# Patient Record
Sex: Male | Born: 1955 | Race: Black or African American | Hispanic: No | Marital: Single
Health system: Midwestern US, Community
[De-identification: ages and names within clinical notes are randomized; demographics above are authoritative.]

## PROBLEM LIST (undated history)

## (undated) DIAGNOSIS — I10 Essential (primary) hypertension: Secondary | ICD-10-CM

## (undated) DIAGNOSIS — I4891 Unspecified atrial fibrillation: Secondary | ICD-10-CM

## (undated) DIAGNOSIS — E7800 Pure hypercholesterolemia, unspecified: Secondary | ICD-10-CM

## (undated) DIAGNOSIS — G4730 Sleep apnea, unspecified: Secondary | ICD-10-CM

---

## 2014-02-25 NOTE — ED Provider Notes (Signed)
HPI Comments: 58 year old African-American male presents with a "stinging" pain in his penis off and on for the past week.  Does report he has a history of Trichomonas approximately 2 months ago.  No discharge.  No testicular pain.  No abdominal pain.  No vomiting.    Patient is a 58 y.o. male presenting with penile problem. The history is provided by the patient.   Penis Pain  Primary symptoms include dysuria. Pertinent negatives include no nausea, no vomiting and no abdominal pain.          Past Medical History   Diagnosis Date   ??? Hypercholesterolemia    ??? HTN (hypertension)    ??? Diabetes (HCC)         No past surgical history on file.      No family history on file.     History     Social History   ??? Marital Status: SINGLE     Spouse Name: N/A     Number of Children: N/A   ??? Years of Education: N/A     Occupational History   ??? Not on file.     Social History Main Topics   ??? Smoking status: Never Smoker    ??? Smokeless tobacco: Not on file   ??? Alcohol Use: Yes      Comment: occ   ??? Drug Use: No   ??? Sexual Activity: Not on file     Other Topics Concern   ??? Not on file     Social History Narrative   ??? No narrative on file                  ALLERGIES: Review of patient's allergies indicates no known allergies.      Review of Systems   Constitutional: Negative for fever.   Respiratory: Negative for shortness of breath.    Gastrointestinal: Negative for nausea, vomiting and abdominal pain.   Genitourinary: Positive for dysuria.   Musculoskeletal: Negative for back pain and neck pain.   Neurological: Negative for headaches.       Filed Vitals:    02/25/14 2230   BP: 167/89   Pulse: 66   Temp: 97.9 ??F (36.6 ??C)   Resp: 18   Height:  (1.778 m)   Weight: 131.543 kg (290 lb)   SpO2: 96%            Physical Exam   Constitutional: He appears well-developed and well-nourished. No distress.   HENT:   Head: Normocephalic and atraumatic.   Cardiovascular: Normal rate.    Pulmonary/Chest: Effort normal.    Genitourinary: Penis normal. No penile tenderness.   No discharge noted   Skin: Skin is warm and dry.   Psychiatric: He has a normal mood and affect.   Nursing note and vitals reviewed.       MDM  Number of Diagnoses or Management Options  Diagnosis management comments: Considering history of STD and current symptoms we'll treat empirically for gonorrhea and chlamydia.  Urinalysis shows no evidence of Trichomonas.      Procedures

## 2014-02-25 NOTE — ED Notes (Signed)
Pt presents to ER for penile pain x 2-3 days, no DC, c/o burning and itching.  Pt's sexual partner of 4 yrs has had trichomonas in the past.

## 2014-02-26 LAB — URINE MICROSCOPIC
Bacteria: 0 /hpf
Casts: 0 /lpf
Epithelial cells: 0 /hpf
RBC: 0 /hpf
WBC: 0 /hpf

## 2014-02-26 MED ORDER — AZITHROMYCIN 250 MG TAB
250 mg | Freq: Every day | ORAL | Status: DC
Start: 2014-02-26 — End: 2014-02-25

## 2014-02-26 MED ORDER — CEFTRIAXONE 250 MG SOLUTION FOR INJECTION
250 mg | INTRAMUSCULAR | Status: DC
Start: 2014-02-26 — End: 2014-02-26
  Administered 2014-02-26: 04:00:00 via INTRAMUSCULAR

## 2014-02-26 MED ORDER — AZITHROMYCIN 250 MG TAB
250 mg | ORAL | Status: AC
Start: 2014-02-26 — End: 2014-02-25
  Administered 2014-02-26: 04:00:00 via ORAL

## 2014-02-26 MED FILL — AZITHROMYCIN 250 MG TAB: 250 mg | ORAL | Qty: 4

## 2014-02-26 MED FILL — CEFTRIAXONE 250 MG SOLUTION FOR INJECTION: 250 mg | INTRAMUSCULAR | Qty: 250

## 2014-02-26 NOTE — ED Notes (Signed)
The patient and spouse was given their discharge instructions and  was given prescriptions.   The  patient verbalized understanding and had no additional questions. The patient was alert and was discharged via Ambulatory, without additional complaints at time of discharge.  No apparent distress noted

## 2014-02-28 LAB — CHLAMYDIA / GC-AMPLIFIED
Chlamydia trachomatis, NAA: NEGATIVE
Neisseria gonorrhoeae, NAA: NEGATIVE

## 2015-11-10 ENCOUNTER — Emergency Department (HOSPITAL_BASED_OUTPATIENT_CLINIC_OR_DEPARTMENT_OTHER)
Admission: EM | Admit: 2015-11-10 | Discharge: 2015-11-10 | Disposition: A | Discharge status: Home / Self Care | Payer: 59 | Attending: Emergency Medicine | Admitting: Emergency Medicine

## 2015-11-10 ENCOUNTER — Emergency Department (HOSPITAL_BASED_OUTPATIENT_CLINIC_OR_DEPARTMENT_OTHER): Payer: 59

## 2015-11-10 ENCOUNTER — Encounter (HOSPITAL_BASED_OUTPATIENT_CLINIC_OR_DEPARTMENT_OTHER): Payer: Self-pay

## 2015-11-10 DIAGNOSIS — M545 Low back pain: Secondary | ICD-10-CM | POA: Diagnosis present

## 2015-11-10 DIAGNOSIS — M5440 Lumbago with sciatica, unspecified side: Secondary | ICD-10-CM | POA: Diagnosis not present

## 2015-11-10 DIAGNOSIS — M5442 Lumbago with sciatica, left side: Secondary | ICD-10-CM

## 2015-11-10 DIAGNOSIS — I10 Essential (primary) hypertension: Secondary | ICD-10-CM | POA: Diagnosis not present

## 2015-11-10 DIAGNOSIS — M5441 Lumbago with sciatica, right side: Secondary | ICD-10-CM

## 2015-11-10 DIAGNOSIS — M5136 Other intervertebral disc degeneration, lumbar region: Secondary | ICD-10-CM | POA: Insufficient documentation

## 2015-11-10 HISTORY — DX: Pure hypercholesterolemia, unspecified: E78.00

## 2015-11-10 HISTORY — DX: Essential (primary) hypertension: I10

## 2015-11-10 MED ORDER — IBUPROFEN 600 MG PO TABS: 600.0000 mg | ORAL_TABLET | Freq: Four times a day (QID) | ORAL | Status: DC | PRN

## 2015-11-10 MED ORDER — ORPHENADRINE CITRATE ER 100 MG PO TB12: 100.0000 mg | ORAL_TABLET | Freq: Two times a day (BID) | ORAL | Status: DC | PRN

## 2015-11-10 MED ORDER — IBUPROFEN 400 MG PO TABS
600.0000 mg | ORAL_TABLET | Freq: Once | ORAL | Status: AC
  Administered 2015-11-10: 600 mg via ORAL
  Filled 2015-11-10: qty 1

## 2015-11-10 MED FILL — ORPHENADRINE 100 MG TAB SA: 100 | 7 days supply | Qty: 14 | Fill #0

## 2015-11-10 MED FILL — IBUPROFEN 600 MG TABLET: 600 | 7 days supply | Qty: 30 | Fill #0

## 2015-11-10 NOTE — Discharge Instructions (Signed)
Medications: Norflex, ibuprofen  Treatment: Take Norflex twice daily as needed for your muscle pain and spasms area do not drive or operate machinery while taking this medication. Take ibuprofen every 6 hours as needed for your pain. Do the back exercises and stretches below as tolerated.  Follow-up: Please follow-up with the neurosurgeon/spine specialist listed on your discharge paperwork or a similar doctor in [place]. Please follow-up with your primary care provider for further evaluation and treatment. Please return to emergency department if you develop any new or worsening symptoms, such as fevers or increasing pain.  For your records, your x-ray showed focal disc disease at the L5-S1 level with endplate spurring, posterior predominant disc narrowing, and retrolisthesis.   Degenerative Disk Disease Degenerative disk disease is a condition caused by the changes that occur in spinal disks as you grow older. Spinal disks are soft and compressible disks located between the bones of your spine (vertebrae). These disks act like shock absorbers. Degenerative disk disease can affect the whole spine. However, the neck and lower back are most commonly affected. Many changes can occur in the spinal disks with aging, such as: 1. The spinal disks may dry and shrink. 2. Small tears may occur in the tough, outer covering of the disk (annulus). 3. The disk space may become smaller due to loss of water. 4. Abnormal growths in the bone (spurs) may occur. This can put pressure on the nerve roots exiting the spinal canal, causing pain. 5. The spinal canal may become narrowed. RISK FACTORS  1. Being overweight. 2. Having a family history of degenerative disk disease. 3. Smoking. 4. There is increased risk if you are doing heavy lifting or have a sudden injury. SIGNS AND SYMPTOMS  Symptoms vary from person to person and may include: 1. Pain that varies in intensity. Some people have no pain, while  others have severe pain. The location of the pain depends on the part of your backbone that is affected. 1. You will have neck or arm pain if a disk in the neck area is affected. 2. You will have pain in your back, buttocks, or legs if a disk in the lower back is affected. 2. Pain that becomes worse while bending, reaching up, or with twisting movements. 3. Pain that may start gradually and then get worse as time passes. It may also start after a major or minor injury. 4. Numbness or tingling in the arms or legs. DIAGNOSIS  Your health care provider will ask you about your symptoms and about activities or habits that may cause the pain. He or she may also ask about any injuries, diseases, or treatments you have had. Your health care provider will examine you to check for the range of movement that is possible in the affected area, to check for strength in your extremities, and to check for sensation in the areas of the arms and legs supplied by different nerve roots. You may also have:  1. An X-ray of the spine. 2. Other imaging tests, such as MRI. TREATMENT  Your health care provider will advise you on the best plan for treatment. Treatment may include: 1. Medicines. 2. Rehabilitation exercises. HOME CARE INSTRUCTIONS  1. Follow proper lifting and walking techniques as advised by your health care provider. 2. Maintain good posture. 3. Exercise regularly as advised by your health care provider. 4. Perform relaxation exercises. 5. Change your sitting, standing, and sleeping habits as advised by your health care provider. 6. Change positions frequently. 7.  Lose weight or maintain a healthy weight as advised by your health care provider. 8. Do not use any tobacco products, including cigarettes, chewing tobacco, or electronic cigarettes. If you need help quitting, ask your health care provider. 9. Wear supportive footwear. 10. Take medicines only as directed by your health care provider. SEEK  MEDICAL CARE IF:  1. Your pain does not go away within 1-4 weeks. 2. You have significant appetite or weight loss. SEEK IMMEDIATE MEDICAL CARE IF:   Your pain is severe.  You notice weakness in your arms, hands, or legs.  You begin to lose control of your bladder or bowel movements.  You have fevers or night sweats. MAKE SURE YOU:   Understand these instructions.  Will watch your condition.  Will get help right away if you are not doing well or get worse.   This information is not intended to replace advice given to you by your health care provider. Make sure you discuss any questions you have with your health care provider.   Document Released: 04/02/2007 Document Revised: 06/26/2014 Document Reviewed: 10/07/2013 Elsevier Interactive Patient Education 2016 [employer]  Back Exercises The following exercises strengthen the muscles that help to support the back. They also help to keep the lower back flexible. Doing these exercises can help to prevent back pain or lessen existing pain. If you have back pain or discomfort, try doing these exercises 2-3 times each day or as told by your health care provider. When the pain goes away, do them once each day, but increase the number of times that you repeat the steps for each exercise (do more repetitions). If you do not have back pain or discomfort, do these exercises once each day or as told by your health care provider. EXERCISES Single Knee to Chest Repeat these steps 3-5 times for each leg: 6. Lie on your back on a firm bed or the floor with your legs extended. 7. Bring one knee to your chest. Your other leg should stay extended and in contact with the floor. 8. Hold your knee in place by grabbing your knee or thigh. 9. Pull on your knee until you feel a gentle stretch in your lower back. 10. Hold the stretch for 10-30 seconds. 11. Slowly release and straighten your leg. Pelvic Tilt Repeat these steps 5-10 times: 5. Lie on your  back on a firm bed or the floor with your legs extended. 6. Bend your knees so they are pointing toward the ceiling and your feet are flat on the floor. 7. Tighten your lower abdominal muscles to press your lower back against the floor. This motion will tilt your pelvis so your tailbone points up toward the ceiling instead of pointing to your feet or the floor. 8. With gentle tension and even breathing, hold this position for 5-10 seconds. Cat-Cow Repeat these steps until your lower back becomes more flexible: 5. Get into a hands-and-knees position on a firm surface. Keep your hands under your shoulders, and keep your knees under your hips. You may place padding under your knees for comfort. 6. Let your head hang down, and point your tailbone toward the floor so your lower back becomes rounded like the back of a cat. 7. Hold this position for 5 seconds. 8. Slowly lift your head and point your tailbone up toward the ceiling so your back forms a sagging arch like the back of a cow. 9. Hold this position for 5 seconds. Press-Ups Repeat these steps 5-10 times:  3. Lie on your abdomen (face-down) on the floor. 4. Place your palms near your head, about shoulder-width apart. 5. While you keep your back as relaxed as possible and keep your hips on the floor, slowly straighten your arms to raise the top half of your body and lift your shoulders. Do not use your back muscles to raise your upper torso. You may adjust the placement of your hands to make yourself more comfortable. 6. Hold this position for 5 seconds while you keep your back relaxed. 7. Slowly return to lying flat on the floor. Bridges Repeat these steps 10 times: 3. Lie on your back on a firm surface. 4. Bend your knees so they are pointing toward the ceiling and your feet are flat on the floor. 5. Tighten your buttocks muscles and lift your buttocks off of the floor until your waist is at almost the same height as your knees. You should  feel the muscles working in your buttocks and the back of your thighs. If you do not feel these muscles, slide your feet 1-2 inches farther away from your buttocks. 6. Hold this position for 3-5 seconds. 7. Slowly lower your hips to the starting position, and allow your buttocks muscles to relax completely. If this exercise is too easy, try doing it with your arms crossed over your chest. Abdominal Crunches Repeat these steps 5-10 times: 11. Lie on your back on a firm bed or the floor with your legs extended. 12. Bend your knees so they are pointing toward the ceiling and your feet are flat on the floor. 13. Cross your arms over your chest. 14. Tip your chin slightly toward your chest without bending your neck. 15. Tighten your abdominal muscles and slowly raise your trunk (torso) high enough to lift your shoulder blades a tiny bit off of the floor. Avoid raising your torso higher than that, because it can put too much stress on your low back and it does not help to strengthen your abdominal muscles. 16. Slowly return to your starting position. Back Lifts Repeat these steps 5-10 times: 3. Lie on your abdomen (face-down) with your arms at your sides, and rest your forehead on the floor. 4. Tighten the muscles in your legs and your buttocks. 5. Slowly lift your chest off of the floor while you keep your hips pressed to the floor. Keep the back of your head in line with the curve in your back. Your eyes should be looking at the floor. 6. Hold this position for 3-5 seconds. 7. Slowly return to your starting position. SEEK MEDICAL CARE IF:  Your back pain or discomfort gets much worse when you do an exercise.  Your back pain or discomfort does not lessen within 2 hours after you exercise. If you have any of these problems, stop doing these exercises right away. Do not do them again unless your health care provider says that you can. SEEK IMMEDIATE MEDICAL CARE IF:  You develop sudden, severe  back pain. If this happens, stop doing the exercises right away. Do not do them again unless your health care provider says that you can.   This information is not intended to replace advice given to you by your health care provider. Make sure you discuss any questions you have with your health care provider.   Document Released: 07/13/2004 Document Revised: 02/24/2015 Document Reviewed: 07/30/2014 Elsevier Interactive Patient Education [employer]2016 Elsevier Inc.

## 2015-11-10 NOTE — ED Notes (Signed)
C/o lower back pain that goes down both legs x 3-4 days-denies injury-NAD-steady gait

## 2015-11-10 NOTE — ED Provider Notes (Signed)
CSN: [id number]     Arrival date & time 11/10/15  1136 History   First MD Initiated Contact with Patient 11/10/15 1152     Chief Complaint  Patient presents with  . Back Pain     (Consider location/radiation/quality/duration/timing/severity/associated sxs/prior Treatment) HPI Comments: Patient is a 60 year old male who presents with low back pain. Patient states he has had similar symptoms in the past that have resolved on their own. Patient states he has had this episode for 3-4 days and feels that the pain is getting worse. The patient states he has a sharp pain and tingling feeling to bilateral lower extremities. Patient denies fall or injury, however patient reports he was working around the house over the weekend. Patient has tried [facility] powder back and body with moderate relief. The patient denies fevers, weight loss, recent surgeries, cancer, IVDA. Patient also denies chest pain, shortness of breath, abdominal pain, nausea, vomiting, urinary symptoms.  The history is provided by the patient.    Past Medical History  Diagnosis Date  . Hypertension   . High cholesterol    History reviewed. No pertinent past surgical history. No family history on file. Social History  Substance Use Topics  . Smoking status: Never Smoker   . Smokeless tobacco: None  . Alcohol Use: No    Review of Systems  Constitutional: Negative for fever and chills.  HENT: Negative for facial swelling and sore throat.   Respiratory: Negative for shortness of breath.   Cardiovascular: Negative for chest pain.  Gastrointestinal: Negative for nausea, vomiting and abdominal pain.  Genitourinary: Negative for dysuria.  Musculoskeletal: Positive for back pain.  Skin: Negative for rash and wound.  Neurological: Negative for headaches.  Psychiatric/Behavioral: The patient is not nervous/anxious.       Allergies  Review of patient's allergies indicates no known allergies.  Home Medications   Prior to  Admission medications   Medication Sig Start Date End Date Taking? Authorizing Provider  ibuprofen (ADVIL,MOTRIN) 600 MG tablet Take 1 tablet (600 mg total) by mouth every 6 (six) hours as needed. 11/10/15   Emi Holes, PA-C  orphenadrine (NORFLEX) 100 MG tablet Take 1 tablet (100 mg total) by mouth 2 (two) times daily as needed for muscle spasms. 11/10/15   Rayonna Heldman M Jigar Zielke, PA-C   BP 138/80 mmHg  Pulse 73  Temp(Src) 98 F (36.7 C) (Oral)  Resp 20  Ht  (1.778 m)  Wt 134.718 kg  BMI 42.61 kg/m2  SpO2 93% Physical Exam  Constitutional: He appears well-developed and well-nourished. No distress.  HENT:  Head: Normocephalic and atraumatic.  Mouth/Throat: Oropharynx is clear and moist. No oropharyngeal exudate.  Eyes: Conjunctivae and EOM are normal. Pupils are equal, round, and reactive to light. Right eye exhibits no discharge. Left eye exhibits no discharge. No scleral icterus.  Neck: Normal range of motion. Neck supple. No thyromegaly present.  Cardiovascular: Normal rate, regular rhythm and normal heart sounds.  Exam reveals no gallop and no friction rub.   No murmur heard. Pulmonary/Chest: Effort normal and breath sounds normal. No stridor. No respiratory distress. He has no wheezes. He has no rales.  Abdominal: Soft. Bowel sounds are normal. He exhibits no distension and no pulsatile midline mass. There is no tenderness. There is no rebound, no guarding and no CVA tenderness.  Musculoskeletal: He exhibits no edema.       Thoracic back: He exhibits tenderness and bony tenderness. He exhibits normal range of motion.  Lumbar back: He exhibits tenderness and bony tenderness. He exhibits normal range of motion.       Back:  -SLR bilaterally; pain with lumbar flexion and extension, patient can heel raise and toe raise without difficulty  Lymphadenopathy:    He has no cervical adenopathy.  Neurological: He is alert. Coordination normal.  CN 3-12 intact, normal sensation  throughout, 5/5 strength in all 4 extremities, equal bilateral grip strength, 2+ patellar reflexes  Skin: Skin is warm and dry. No rash noted. He is not diaphoretic. No pallor.  Psychiatric: He has a normal mood and affect.  Nursing note and vitals reviewed.   ED Course  Procedures (including critical care time) Labs Review Labs Reviewed - No data to display  Imaging Review Dg Thoracic Spine 2 View  11/10/2015  CLINICAL DATA:  Thoracic back pain for 1 week. EXAM: THORACIC SPINE 2 VIEWS COMPARISON:  None. FINDINGS: No evidence of fracture, malalignment, or focal bone lesion. No endplate erosion. Lower thoracic spondylosis without focal or advanced disc narrowing. Visualized lungs are clear. IMPRESSION: 1. No acute finding. 2. Lower thoracic spondylosis. Electronically Signed   By: Marnee SpringJonathon  Watts M.D.   On: 11/10/2015 13:08   Dg Lumbar Spine Complete  11/10/2015  CLINICAL DATA:  Lumbar back pain for 1 week. No indication of injury. EXAM: LUMBAR SPINE - COMPLETE 4+ VIEW COMPARISON:  None. FINDINGS: No evidence of acute fracture or focal bone lesion. Focal disc disease at the L5-S1 level with endplate spurring, posterior predominant disc narrowing, and retrolisthesis. Associated endplates are indistinct and there is sclerosis. Atherosclerotic calcification. IMPRESSION: Focal disc disease at L5-S1 which is likely advanced degeneration. Endplates are indistinct and if there are clinical or laboratory inflammatory findings MRI could evaluate for discitis. Electronically Signed   By: Marnee SpringJonathon  Watts M.D.   On: 11/10/2015 13:13   I have personally reviewed and evaluated these images and lab results as part of my medical decision-making.   EKG Interpretation None      MDM   Patient with back pain.  No neurological deficits and normal neuro exam. No pulsatile abdominal mass. Patient is ambulatory.  No loss of bowel or bladder control.  No concern for cauda equina.  No fever, night sweats, weight  loss, h/o cancer, IVDA, no recent procedure to back. No urinary symptoms suggestive of UTI. Lumbar x-ray shows focal disc disease at L5-S1 which is likely advanced degeneration, posterior predominant disc narrowing, and retrolisthesis. Supportive care and strict return precaution discussed. Appears safe for discharge at this time. Follow up with neurosurgery and PCP as indicated in discharge paperwork. Blood pressure decreased throughout ED course. Patient in satisfactory condition at discharge.   Final diagnoses:  Bilateral low back pain with sciatica, sciatica laterality unspecified  Degenerative disc disease, lumbar       Emi Holeslexandra M Dannelle Rhymes, PA-C 11/10/15 1401  Geoffery Lyonsouglas Delo, MD 11/10/15 [phone]

## 2019-05-08 ENCOUNTER — Encounter: Payer: Self-pay | Admitting: Emergency Medicine

## 2019-05-08 ENCOUNTER — Other Ambulatory Visit: Payer: Self-pay

## 2019-05-08 ENCOUNTER — Emergency Department
Admission: EM | Admit: 2019-05-08 | Discharge: 2019-05-08 | Disposition: A | Discharge status: Home / Self Care | Payer: Medicare Other | Attending: Emergency Medicine | Admitting: Emergency Medicine

## 2019-05-08 ENCOUNTER — Emergency Department: Payer: Medicare Other

## 2019-05-08 DIAGNOSIS — Y999 Unspecified external cause status: Secondary | ICD-10-CM | POA: Insufficient documentation

## 2019-05-08 DIAGNOSIS — Y929 Unspecified place or not applicable: Secondary | ICD-10-CM | POA: Diagnosis not present

## 2019-05-08 DIAGNOSIS — Z79899 Other long term (current) drug therapy: Secondary | ICD-10-CM | POA: Insufficient documentation

## 2019-05-08 DIAGNOSIS — Y9389 Activity, other specified: Secondary | ICD-10-CM | POA: Insufficient documentation

## 2019-05-08 DIAGNOSIS — W270XXA Contact with workbench tool, initial encounter: Secondary | ICD-10-CM | POA: Insufficient documentation

## 2019-05-08 DIAGNOSIS — Z23 Encounter for immunization: Secondary | ICD-10-CM | POA: Insufficient documentation

## 2019-05-08 DIAGNOSIS — I10 Essential (primary) hypertension: Secondary | ICD-10-CM | POA: Insufficient documentation

## 2019-05-08 DIAGNOSIS — S61310A Laceration without foreign body of right index finger with damage to nail, initial encounter: Secondary | ICD-10-CM | POA: Insufficient documentation

## 2019-05-08 HISTORY — DX: Sleep apnea, unspecified: G47.30

## 2019-05-08 HISTORY — DX: Unspecified atrial fibrillation: I48.91

## 2019-05-08 MED ORDER — LIDOCAINE HCL (PF) 1 % IJ SOLN
10.0000 mL | Freq: Once | INTRAMUSCULAR | Status: AC
Start: 2019-05-08 — End: 2019-05-08
  Administered 2019-05-08: 10 mL
  Filled 2019-05-08: qty 10

## 2019-05-08 MED ORDER — CEPHALEXIN 500 MG PO CAPS: 1000.0000 mg | ORAL_CAPSULE | Freq: Two times a day (BID) | ORAL | 0 refills | Status: AC

## 2019-05-08 MED ORDER — MELOXICAM 15 MG PO TABS: 15.0000 mg | ORAL_TABLET | Freq: Every day | ORAL | 0 refills | Status: AC

## 2019-05-08 MED ORDER — HYDROCODONE-ACETAMINOPHEN 5-325 MG PO TABS: 1.0000 | ORAL_TABLET | ORAL | 0 refills | Status: AC | PRN

## 2019-05-08 MED ORDER — OXYCODONE-ACETAMINOPHEN 5-325 MG PO TABS
1.0000 | ORAL_TABLET | Freq: Once | ORAL | Status: AC
  Administered 2019-05-08: 1 via ORAL
  Filled 2019-05-08: qty 1

## 2019-05-08 MED ORDER — TETANUS-DIPHTH-ACELL PERTUSSIS 5-2.5-18.5 LF-MCG/0.5 IM SUSP
0.5000 mL | Freq: Once | INTRAMUSCULAR | Status: AC
  Administered 2019-05-08: 16:00:00 0.5 mL via INTRAMUSCULAR
  Filled 2019-05-08: qty 0.5

## 2019-05-08 NOTE — ED Triage Notes (Signed)
Pt presents to ED via POV with c/o R finger laceration and R index finger fracture. Pt states hit his finger with a hammer while working on his car. [facility] sent from [facility] at [facility] time.

## 2019-05-08 NOTE — ED Provider Notes (Signed)
[facility] Emergency Department Provider Note  ____________________________________________  Time seen: Approximately 3:24 PM  I have reviewed the triage vital signs and the nursing notes.   HISTORY  Chief Complaint Laceration and Hand Pain (Right index finger)    HPI Charles Blair is a 63 y.o. male who presents the emergency department complaining of injury to the index finger of the right hand.  Patient reports that he was working on his vehicle using a hammer to loosen up part of a seized axle.  Patient states while using the hammer he accidentally struck his finger.  Patient sustained a laceration and went to urgent care.  Urgent care evaluated the area with an x-ray and states that he had an underlying fracture and sent him to the emergency department for further evaluation.  No attempts at closure was performed at urgent care.  Patient is unsure of his last tetanus shot.  No other injury or complaint.         Past Medical History:  Diagnosis Date  . A-fib (HCC)   . High cholesterol   . Hypertension   . Sleep apnea     There are no active problems to display for this patient.   History reviewed. No pertinent surgical history.  Prior to Admission medications   Medication Sig Start Date End Date Taking? Authorizing Provider  atenolol-chlorthalidone (TENORETIC) 50-25 MG tablet Take 1 tablet by mouth daily.   Yes [provider]  atorvastatin (LIPITOR) 10 MG tablet Take 10 mg by mouth daily.   Yes [provider]  cephALEXin (KEFLEX) 500 MG capsule Take 2 capsules (1,000 mg total) by mouth 2 (two) times daily. 05/08/19   Jeffry Vogelsang, Delorise RoyalsJonathan D, PA-C  HYDROcodone-acetaminophen (NORCO/VICODIN) 5-325 MG tablet Take 1 tablet by mouth every 4 (four) hours as needed for moderate pain. 05/08/19   Jenevieve Kirschbaum, Delorise RoyalsJonathan D, PA-C  meloxicam (MOBIC) 15 MG tablet Take 1 tablet (15 mg total) by mouth daily. 05/08/19   Aerion Bagdasarian, Delorise RoyalsJonathan D, PA-C     Allergies Patient has no known allergies.  No family history on file.  Social History Social History   Tobacco Use  . Smoking status: Never Smoker  . Smokeless tobacco: Never Used  Substance Use Topics  . Alcohol use: No  . Drug use: No     Review of Systems  Constitutional: No fever/chills Eyes: No visual changes. No discharge ENT: No upper respiratory complaints. Cardiovascular: no chest pain. Respiratory: no cough. No SOB. Gastrointestinal: No abdominal pain.  No nausea, no vomiting.   Musculoskeletal: Positive for injury to the right index finger with laceration and possible underlying fracture Skin: Negative for rash, abrasions, lacerations, ecchymosis. Neurological: Negative for headaches, focal weakness or numbness. 10-point ROS otherwise negative.  ____________________________________________   PHYSICAL EXAM:  VITAL SIGNS: ED Triage Vitals  Enc Vitals Group     BP 05/08/19 1453 (!) 145/78     Pulse Rate 05/08/19 1453 92     Resp 05/08/19 1453 18     Temp 05/08/19 1452 98.3 F (36.8 C)     Temp Source 05/08/19 1452 Oral     SpO2 05/08/19 1453 96 %     Weight 05/08/19 1452 293 lb (132.9 kg)     Height 05/08/19 1452 5\' 10"  (1.778 m)     Head Circumference --      Peak Flow --      Pain Score 05/08/19 1452 8     Pain Loc --  Pain Edu? --      Excl. in [place]? --      Constitutional: Alert and oriented. Well appearing and in no acute distress. Eyes: Conjunctivae are normal. PERRL. EOMI. Head: Atraumatic. ENT:      Ears:       Nose: No congestion/rhinnorhea.      Mouth/Throat: Mucous membranes are moist.  Neck: No stridor.    Cardiovascular: Normal rate, regular rhythm. Normal S1 and S2.  Good peripheral circulation. Respiratory: Normal respiratory effort without tachypnea or retractions. Lungs CTAB. Good air entry to the bases with no decreased or absent breath sounds. Musculoskeletal: Full range of motion to all extremities. No gross  deformities appreciated.  Visualization of the right hand revealed a laceration extending from the lateral nailbed towards finger pad.  Both measure approximately 1 cm.  Edges along the nailbed was slightly macerated but nailbed still attached.  Patient is able to extend and flex the digit at this time.  Significant tenderness to palpation of the distal phalanx.  Capillary refill intact.  No active bleeding.  No visible foreign body in the laceration. Neurologic:  Normal speech and language. No gross focal neurologic deficits are appreciated.  Skin:  Skin is warm, dry and intact. No rash noted. Psychiatric: Mood and affect are normal. Speech and behavior are normal. Patient exhibits appropriate insight and judgement.   ____________________________________________   LABS (all labs ordered are listed, but only abnormal results are displayed)  Labs Reviewed - No data to display ____________________________________________  EKG   ____________________________________________  RADIOLOGY I personally viewed and evaluated these images as part of my medical decision making, as well as reviewing the written report by the radiologist.  Dg Hand Complete Right  Result Date: 05/08/2019 CLINICAL DATA:  Smashed finger with a hammer while working on car. EXAM: RIGHT HAND - COMPLETE 3+ VIEW COMPARISON:  None. FINDINGS: There is a comminuted and mildly displaced distal tuft fracture of the index finger. No radiopaque foreign body is identified. No involvement of the DIP joint. The other bony structures are intact. IMPRESSION: Mildly displaced distal tuft fracture. No intra-articular involvement and no radiopaque foreign body. Electronically Signed   By: Marijo Sanes M.D.   On: 05/08/2019 15:59    ____________________________________________    PROCEDURES  Procedure(s) performed:    Marland KitchenMarland KitchenLaceration Repair  Date/Time: 05/08/2019 5:15 PM Performed by: Darletta Moll, PA-C Authorized by:  Darletta Moll, PA-C   Consent:    Consent obtained:  Verbal   Consent given by:  Patient   Risks discussed:  Pain, poor wound healing and infection Anesthesia (see MAR for exact dosages):    Anesthesia method:  Nerve block   Block location:  Index finger R hand   Block needle gauge:  27 G   Block anesthetic:  Lidocaine 1% w/o epi   Block technique:  Digital block   Block injection procedure:  Anatomic landmarks identified, introduced needle, negative aspiration for blood and incremental injection   Block outcome:  Anesthesia achieved Laceration details:    Location:  Finger   Finger location:  R index finger   Length (cm):  1 Repair type:    Repair type:  Simple Pre-procedure details:    Preparation:  Imaging obtained to evaluate for foreign bodies and patient was prepped and draped in usual sterile fashion Exploration:    Hemostasis achieved with:  Direct pressure   Wound exploration: wound explored through full range of motion and entire depth of wound probed and visualized  Wound extent: underlying fracture     Wound extent: no foreign bodies/material noted, no muscle damage noted, no nerve damage noted, no tendon damage noted and no vascular damage noted     Contaminated: no   Treatment:    Area cleansed with:  Betadine   Amount of cleaning:  Extensive   Irrigation solution:  Sterile saline   Irrigation volume:  500 ml   Irrigation method:  Syringe Skin repair:    Repair method:  Sutures   Suture size:  4-0   Suture material:  Nylon   Suture technique:  Simple interrupted   Number of sutures:  4 Approximation:    Approximation:  Close Post-procedure details:    Dressing:  Splint for protection   Patient tolerance of procedure:  Tolerated well, no immediate complications      Medications  lidocaine (PF) (XYLOCAINE) 1 % injection 10 mL (has no administration in time range)  oxyCODONE-acetaminophen (PERCOCET/ROXICET) 5-325 MG per tablet 1 tablet (1  tablet Oral Given 05/08/19 1536)  Tdap (BOOSTRIX) injection 0.5 mL (0.5 mLs Intramuscular Given 05/08/19 1536)     ____________________________________________   INITIAL IMPRESSION / ASSESSMENT AND PLAN / ED COURSE  Pertinent labs & imaging results that were available during my care of the patient were reviewed by me and considered in my medical decision making (see chart for details).  Review of the [place] CSRS was performed in accordance of the [facility] prior to dispensing any controlled drugs.           Patient's diagnosis is consistent with laceration to the index finger, distal tuft fracture.  Patient presented to emergency department after striking his finger with a hammer.  Patient had originally been seen in urgent care and was referred to the emergency department given underlying fracture.  Patient had a nondisplaced tuft fracture with no loss of range of motion to the digit.  Sensation intact to the digit.  Wound is closed as described above and finger is splinted.  Patient tolerated well no complications.  Wound care instructions discussed with patient.  Follow-up with orthopedics.  Patient will be prescribed limited pain medication, anti-inflammatory and antibiotics prophylactically..Patient is given ED precautions to return to the ED for any worsening or new symptoms.     ____________________________________________  FINAL CLINICAL IMPRESSION(S) / ED DIAGNOSES  Final diagnoses:  Laceration of right index finger without foreign body with damage to nail, initial encounter      NEW MEDICATIONS STARTED DURING THIS VISIT:  ED Discharge Orders         Ordered    cephALEXin (KEFLEX) 500 MG capsule  2 times daily     05/08/19 1732    HYDROcodone-acetaminophen (NORCO/VICODIN) 5-325 MG tablet  Every 4 hours PRN     05/08/19 1732    meloxicam (MOBIC) 15 MG tablet  Daily     05/08/19 1732              This chart was dictated using voice recognition software/Dragon.  Despite best efforts to proofread, errors can occur which can change the meaning. Any change was purely unintentional.    Racheal Patches, PA-C 05/08/19 1747    Dionne Bucy, MD 05/08/19 2022

## 2019-05-08 NOTE — ED Notes (Signed)
Pt from [facility] with finger lac to R index finger- [facility] has wrapped the finger and bleeding controlled at this time

## 2020-07-22 IMAGING — DX DG HAND COMPLETE 3+V*R*
3 series · 3 of 3 positions shown · non-contrast
Comparison: None.

CLINICAL DATA: Smashed finger with a hammer while working on car.

EXAM:
RIGHT HAND - COMPLETE 3+ VIEW

[hand ap]
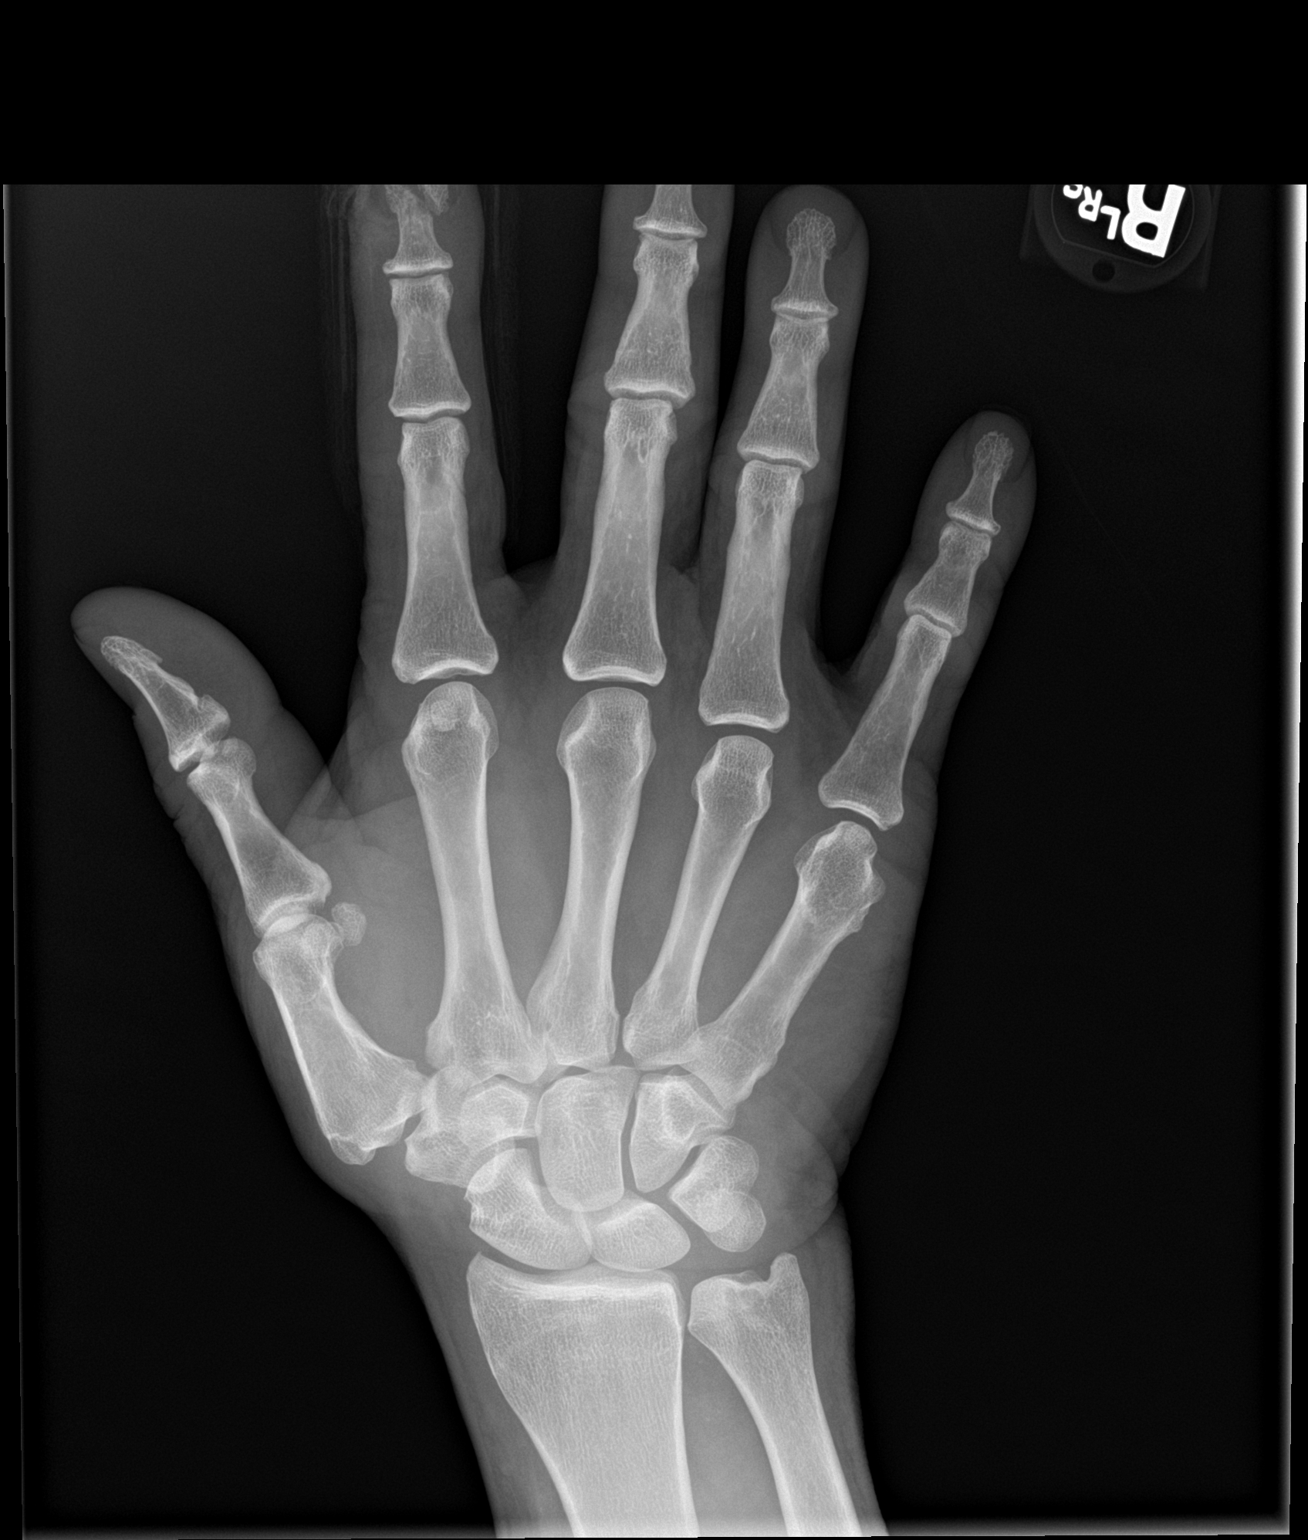

[hand obl]
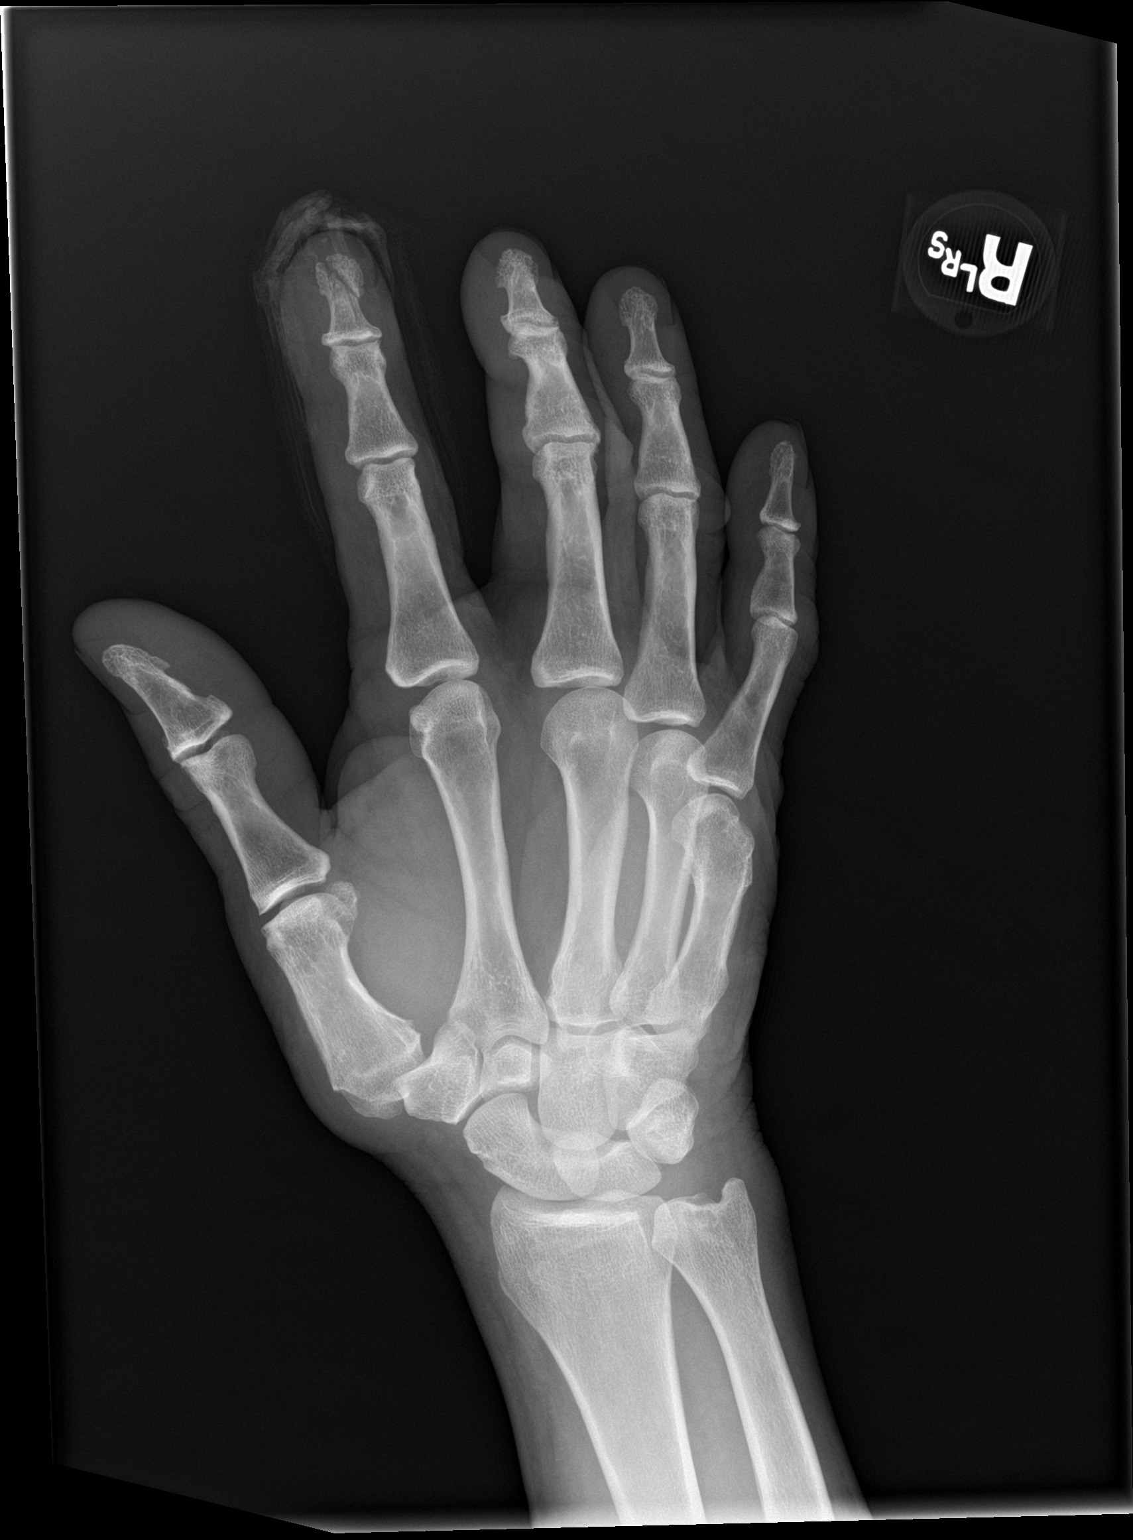

[hand lat]
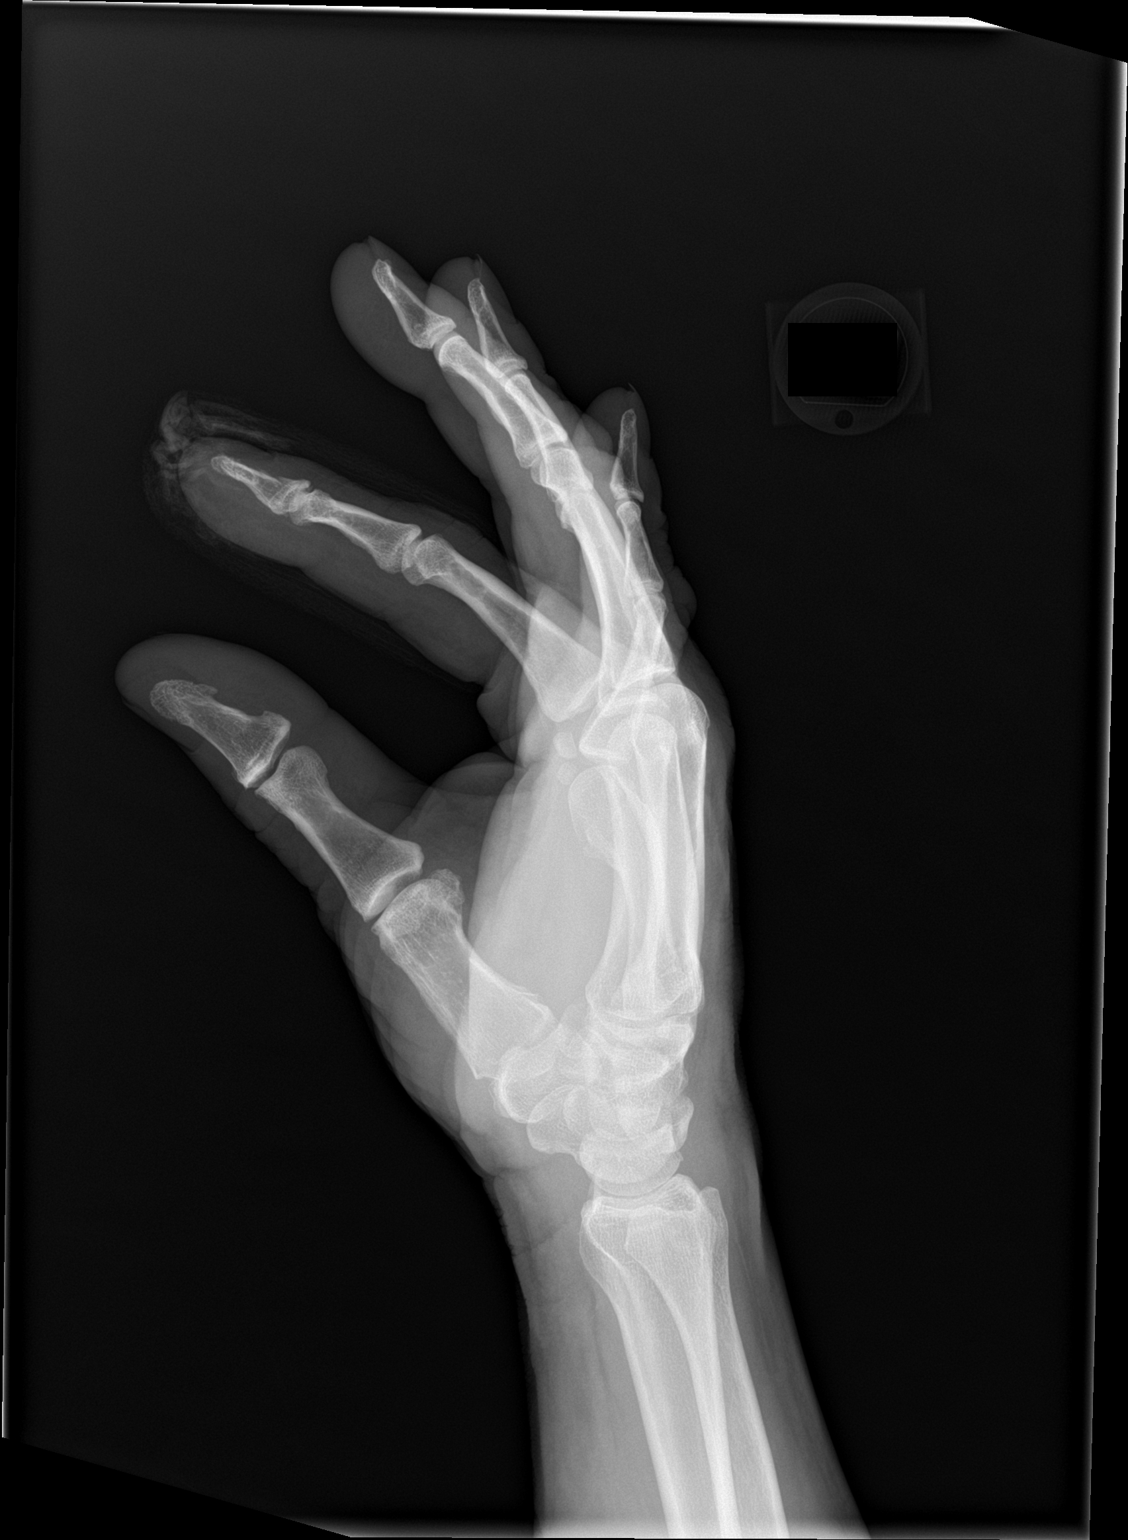

[3 of 3 positions shown; findings below may reference images not displayed]

FINDINGS: There is a comminuted and mildly displaced distal tuft fracture of
the index finger. No radiopaque foreign body is identified. No
involvement of the DIP joint. The other bony structures are intact.
IMPRESSION: Mildly displaced distal tuft fracture. No intra-articular
involvement and no radiopaque foreign body.
